# Patient Record
Sex: Female | Born: 1945 | Race: White | Hispanic: No | Smoking: Current every day smoker
Health system: Southern US, Community
[De-identification: ages and names within clinical notes are randomized; demographics above are authoritative.]

## PROBLEM LIST (undated history)

## (undated) DIAGNOSIS — I2510 Atherosclerotic heart disease of native coronary artery without angina pectoris: Secondary | ICD-10-CM

## (undated) DIAGNOSIS — J45909 Unspecified asthma, uncomplicated: Secondary | ICD-10-CM

## (undated) DIAGNOSIS — M4800 Spinal stenosis, site unspecified: Secondary | ICD-10-CM

## (undated) DIAGNOSIS — G8929 Other chronic pain: Secondary | ICD-10-CM

## (undated) DIAGNOSIS — R251 Tremor, unspecified: Secondary | ICD-10-CM

## (undated) DIAGNOSIS — M1990 Unspecified osteoarthritis, unspecified site: Secondary | ICD-10-CM

## (undated) DIAGNOSIS — I10 Essential (primary) hypertension: Secondary | ICD-10-CM

## (undated) DIAGNOSIS — J439 Emphysema, unspecified: Secondary | ICD-10-CM

## (undated) DIAGNOSIS — J449 Chronic obstructive pulmonary disease, unspecified: Secondary | ICD-10-CM

## (undated) DIAGNOSIS — M549 Dorsalgia, unspecified: Secondary | ICD-10-CM

## (undated) DIAGNOSIS — M4720 Other spondylosis with radiculopathy, site unspecified: Secondary | ICD-10-CM

## (undated) HISTORY — DX: Other spondylosis with radiculopathy, site unspecified: M47.20

## (undated) HISTORY — DX: Dorsalgia, unspecified: M54.9

## (undated) HISTORY — PX: CHOLECYSTECTOMY: SHX55

## (undated) HISTORY — DX: Tremor, unspecified: R25.1

## (undated) HISTORY — DX: Unspecified asthma, uncomplicated: J45.909

## (undated) HISTORY — DX: Other chronic pain: G89.29

## (undated) HISTORY — PX: JOINT REPLACEMENT: SHX530

## (undated) HISTORY — DX: Chronic obstructive pulmonary disease, unspecified: J44.9

---

## 2003-06-10 ENCOUNTER — Emergency Department (HOSPITAL_COMMUNITY): Admission: EM | Admit: 2003-06-10 | Discharge: 2003-06-11 | Payer: Self-pay | Admitting: Emergency Medicine

## 2003-07-12 ENCOUNTER — Emergency Department (HOSPITAL_COMMUNITY): Admission: EM | Admit: 2003-07-12 | Discharge: 2003-07-12 | Payer: Self-pay | Admitting: Emergency Medicine

## 2003-08-18 ENCOUNTER — Encounter: Admission: RE | Admit: 2003-08-18 | Discharge: 2003-09-26 | Payer: Self-pay | Admitting: Family Medicine

## 2005-03-21 ENCOUNTER — Emergency Department (HOSPITAL_COMMUNITY): Admission: EM | Admit: 2005-03-21 | Discharge: 2005-03-21 | Payer: Self-pay | Admitting: Emergency Medicine

## 2005-05-14 ENCOUNTER — Emergency Department (HOSPITAL_COMMUNITY): Admission: EM | Admit: 2005-05-14 | Discharge: 2005-05-15 | Payer: Self-pay | Admitting: Emergency Medicine

## 2005-09-29 ENCOUNTER — Emergency Department (HOSPITAL_COMMUNITY): Admission: EM | Admit: 2005-09-29 | Discharge: 2005-09-29 | Payer: Self-pay | Admitting: Emergency Medicine

## 2006-02-12 ENCOUNTER — Emergency Department (HOSPITAL_COMMUNITY): Admission: EM | Admit: 2006-02-12 | Discharge: 2006-02-12 | Payer: Self-pay | Admitting: Emergency Medicine

## 2006-02-25 ENCOUNTER — Emergency Department (HOSPITAL_COMMUNITY): Admission: EM | Admit: 2006-02-25 | Discharge: 2006-02-25 | Payer: Self-pay | Admitting: Family Medicine

## 2006-09-24 ENCOUNTER — Emergency Department (HOSPITAL_COMMUNITY): Admission: EM | Admit: 2006-09-24 | Discharge: 2006-09-24 | Payer: Self-pay | Admitting: Emergency Medicine

## 2007-08-10 ENCOUNTER — Encounter (INDEPENDENT_AMBULATORY_CARE_PROVIDER_SITE_OTHER): Payer: Self-pay | Admitting: Emergency Medicine

## 2007-08-10 ENCOUNTER — Ambulatory Visit: Payer: Self-pay | Admitting: *Deleted

## 2007-08-10 ENCOUNTER — Emergency Department (HOSPITAL_COMMUNITY): Admission: EM | Admit: 2007-08-10 | Discharge: 2007-08-10 | Payer: Self-pay | Admitting: Emergency Medicine

## 2007-12-18 ENCOUNTER — Emergency Department (HOSPITAL_COMMUNITY): Admission: EM | Admit: 2007-12-18 | Discharge: 2007-12-19 | Payer: Self-pay | Admitting: Physician Assistant

## 2008-04-02 ENCOUNTER — Inpatient Hospital Stay (HOSPITAL_COMMUNITY): Admission: EM | Admit: 2008-04-02 | Discharge: 2008-04-07 | Payer: Self-pay | Admitting: Emergency Medicine

## 2008-04-25 ENCOUNTER — Emergency Department (HOSPITAL_COMMUNITY): Admission: EM | Admit: 2008-04-25 | Discharge: 2008-04-25 | Payer: Self-pay | Admitting: Emergency Medicine

## 2009-02-22 ENCOUNTER — Emergency Department (HOSPITAL_COMMUNITY): Admission: EM | Admit: 2009-02-22 | Discharge: 2009-02-22 | Payer: Self-pay | Admitting: Family Medicine

## 2009-07-07 ENCOUNTER — Emergency Department (HOSPITAL_COMMUNITY): Admission: EM | Admit: 2009-07-07 | Discharge: 2009-07-07 | Payer: Self-pay | Admitting: Family Medicine

## 2009-08-26 ENCOUNTER — Emergency Department (HOSPITAL_COMMUNITY): Admission: EM | Admit: 2009-08-26 | Discharge: 2009-08-26 | Payer: Self-pay | Admitting: Family Medicine

## 2010-11-12 NOTE — Op Note (Signed)
Audrey [name], Audrey [name]         ACCOUNT NO.:  [id number]   MEDICAL RECORD NO.:  [id number]          PATIENT TYPE:  INP   LOCATION:  5511                         FACILITY:  [facility]   PHYSICIAN:  John C. Madilyn Fireman, M.D.    DATE OF BIRTH:  April 10, 1946   DATE OF PROCEDURE:  04/05/2008  DATE OF DISCHARGE:                               OPERATIVE REPORT   INDICATIONS FOR PROCEDURE:  Retained common bile duct stone.  ERCP 2  days ago, unsuccessful.   PROCEDURE:  The patient was placed in the prone position and placed on  pulse monitor with continuous low-flow oxygen delivered by nasal  cannula.  She was sedated with 150 mcg of IV fentanyl and 40 mg of IV  Versed, and also given 1 mg of IV glucagon.  The Olympus video side-  viewing endoscope was advanced blindly in the oropharynx, esophagus, and  stomach.  The pylorus was traversed and the papilla of Vater located on  the medial duodenal wall.  There was bile seen to drain from it.  Dr.  Evette Cristal and I both attempted cannulation, but I was only able to gain  shallow cannulation initially.  Dr. Evette Cristal then passed a wire up into a  duct which appeared to be the pancreatic duct which was confirmed by  injection.  With repositioning, he obtained selective cannulation of the  common bile duct.  Cholangiogram was obtained which showed a single  about 6 mm filling defects in the proximal or in the common hepatic  duct.  I then took over the procedure and performed a large  sphincterotomy using an adjustable 18-mm balloon catheter.  I was able  to deliver the stones and some crumbly fragments.  After 2 or 3 balloon  sweeps with no further debris or stones seen to come through and an  occlusion cholangiogram which showed no further defects, the procedure  was terminated.  The scope was withdrawn and the patient returned to the  recovery room in stable condition.  She tolerated the procedure well.  There were no immediate complications.   IMPRESSION:   Common bile duct stone status post removal with a balloon  after sphincterotomy.   PLAN:  Observe overnight and check liver function tests in the morning.           ______________________________  Everardo All. Madilyn Fireman, M.D.     JCH/MEDQ  D:  04/05/2008  T:  04/06/2008  Job:  [id number]

## 2010-11-12 NOTE — Discharge Summary (Signed)
NAMEMITSUE, PEERY NO.:  [id number]   MEDICAL RECORD NO.:  [id number]          PATIENT TYPE:  INP   LOCATION:  5511                         FACILITY:  [facility]   PHYSICIAN:  Shirley Friar, MDDATE OF BIRTH:  1946/04/19   DATE OF ADMISSION:  04/02/2008  DATE OF DISCHARGE:                               DISCHARGE SUMMARY   REASON FOR ADMISSION:  Abdominal pain, elevated liver function tests,  and common bile duct obstruction.   HISTORY OF PRESENT ILLNESS:  Ms. Counce is a 65 year old white female  who is unassigned, being seen at the request of Dr. Rosalia Hammers due to acute  onset of right upper quadrant pain and epigastric pain.  This pain  started Sunday morning at 2 a.m. and woke her up from sleep.  It  radiated into her back and lasted several hours and described as a sharp  and burning sensation.  It was associated with nausea and bilious  vomiting.  CT scan done in the emergency room shows common bile duct  dilation and distal common bile duct stones.  Complete radiology results  as noted in CT report.  Her T-bili was 1.8, ALP was 148, AST 611, and  ALT 293.  White blood count 14 and lipase 37.  She reports no appetite  for the past month and a 50-pound unintentional weight loss in the last  90 days.  She is status post open cholecystectomy in her early 80s.   PAST MEDICAL HISTORY:  1. COPD.  2. Coronary artery disease.  3. Hypertension.  4. Arthritis.  5. Spinal stenosis.  6. Tobacco abuse.   MEDICINES:  Aspirin, nitroglycerin as needed, and Duragesic patch.   ALLERGIES:  PENICILLIN.   FAMILY HISTORY:  Noncontributory.   SOCIAL HISTORY:  Tobacco abuse, denies alcohol or drugs.   REVIEW OF SYSTEMS:  Negative from GI standpoint, except as stated above.   PHYSICAL EXAMINATION:  VITAL SIGNS:  Temperature 97.5, pulse 77, blood  pressure 162/78, and O2 sats 98% on room air.  GENERAL:  Obese, no acute distress, and alert.  ABDOMEN:  Right upper quadrant  epigastric tenderness with guarding,  soft, nondistended, and positive bowel sounds.  CARDIOVASCULAR:  Regular rate and rhythm.  CHEST: Clear to auscultation anteriorly.   LABORATORY DATA:  White blood count 14.0, hemoglobin 15.1, and platelet  count 213.  T-bili 1.8, ALP 148, AST 611, ALT 293, and lipase 37.  All  other labs are placed in the hospital chart.   IMPRESSION:  A 65 year old white female presented with acute onset of  right upper quadrant epigastric pain with common bile duct dilation and  appears to have distal common bile duct stones.  Her elevated liver  function test in conjunction with the CT scan suggested extrahepatic  ductal dilation.  Differential diagnosis includes biliary stones versus  ampullary mass.  The pancreas appeared normal on CT scan per report.   We admitted the patient to the telemetry bed, placed on IV fluids with  bowel rest except for ice chips and sips of water.  Follow labs closely.  We will need to decide whether to do ERCP versus  MRCP pending on her  labs in the a.m.  The patient most likely will need ERCP due to concern  for common bile stones and acute onset of these symptoms.  I did discuss  the risk of ERCP with her and this will be readdressed again prior to  any procedures.      Shirley Friar, MD  Electronically Signed     VCS/MEDQ  D:  04/02/2008  T:  04/03/2008  Job:  [id number]   cc:   Everardo All. Madilyn Fireman, M.D.  Elvia Collum, MD

## 2010-11-12 NOTE — Discharge Summary (Signed)
[name], [name]         ACCOUNT NO.:  [id number]   MEDICAL RECORD NO.:  [id number]          PATIENT TYPE:  INP   LOCATION:  5511                         FACILITY:  [facility]   PHYSICIAN:  John C. Madilyn Fireman, M.D.    DATE OF BIRTH:  [birth year]   DATE OF ADMISSION:  04/02/2008  DATE OF DISCHARGE:  04/07/2008                               DISCHARGE SUMMARY   ADMITTING DIAGNOSIS:  Right upper quadrant pain and elevated liver  function tests.   DISCHARGE DIAGNOSIS:  Choledocholithiasis status post endoscopic  retrograde cholangiopancreatography x2.  The rest of her discharge  diagnoses are consistent with her past medical history including COPD,  coronary artery disease, hypertension, arthritis, spinal stenosis,  tobacco abuse, and status post cholecystectomy.   CONSULTATIONS:  None.   PROCEDURES:  The patient had 2 endoscopic retrograde  cholangiopancreatographies done during her hospital stay.  Her first one  done on April 03, 2008, was unsuccessful and that they were unable to  achieve decannulation into the common bile duct.  She did have a normal  pancreatogram.  The second endoscopic retrograde  cholangiopancreatography was done on April 05, 2008, by Dr. Dorena Cookey.  He successfully removed common bile duct stones and gravel with a  balloon after sphincterotomy.  He was assisted by Dr. Herbert Moors.   RADIOLOGICAL EXAMINATIONS:  She had a CT abdomen and pelvis done on  April 02, 2008.   IMPRESSION:  Common bile duct obstruction secondary to distal common  bile duct stones, measuring approximately 8 mm in diameter with no other  acute abnormalities.  She had fluoroscopy used during her endoscopic  retrograde cholangiopancreatographies.  Fluoroscopy done on April 05, 2008, demonstrated successful removal of common bile duct stone by Dr.  Madilyn Fireman.   HISTORY AND BRIEF HOSPITAL COURSE:  This patient is a 65 year old  Caucasian female who was seen at the [facility] at  the request of Dr. Rosalia Hammers due to acute onset of right upper quadrant and  epigastric pain.  The pain started on Sunday morning at 2:00 a.m. and  woke her from sleep.  Initial labs found her total bilirubin to be 1.8,  alk phos 148, AST 611, and ALT 293 with a white blood count of 14 and a  lipase of 37.  The patient reported that she had a 50 pounds  unintentional weight loss in the last 3 months, partially due to poor  finances and lack of food.  She had had an open cholecystectomy in her  early 63s.  She was admitted on April 02, 2008, placed on IV  hydration and pain medications, left n.p.o. for ERCP procedure the next  day.  ERCP was attempted but stones could not successfully be removed.  The patient was kept on empiric antibiotics and pain medications and  given a clear liquid diet.  The ERCP was reattempted on April 05, 2008,  and was successful.  Since that time, the patient has been closely  monitored.  Her diet has been advanced to a full liquid diet.  This  morning, she tells me that she is having very minimal pain.  She is  requesting discharge to home, says she feels good.   Labs this morning show an amylase of 83 and lipase 62.  LFTs were an AST  of 33, ALT 89, alk phos 154, total bilirubin 1.8.  Her BMETs within  normal limits other than a glucose of 130.  Her serum albumin 2.6,  hemoglobin 13.0, hematocrit 39.4, white count 6.6, and platelets were  148,000.   She is alert and oriented in no apparent distress.  Her heart has  regular rate and rhythm.  Lungs are clear.  Abdomen is soft,  nondistended, and tender to deep palpation in the epigastric.  She has  active bowel sounds.  She is being discharged to home in good condition.   Followup is scheduled on April 17, 2008, with Dr. Madilyn Fireman.   Discharge instructions include calling our office if she develops fever,  increased pain, or her loose bowel movement do not improve substantially  over the next few  days.   Her discharge medications include an 81 mg aspirin, nitroglycerin  sublingual p.r.n., and Darvocet-N 100 one pill q.4-6 h. p.r.n. pain.  She also was on a Duragesic patch prior to admission, and she has been  advised not to use that patch while she is on the Darvocet.      Audrey Police, PA    ______________________________  Everardo All Madilyn Fireman, M.D.    MLY/MEDQ  D:  04/07/2008  T:  04/07/2008  Job:  [id number]   cc:   Dr. Carroll Sage

## 2010-11-12 NOTE — Op Note (Signed)
NAMESHANEDRA, Audrey [name]         ACCOUNT NO.:  [id number]   MEDICAL RECORD NO.:  [id number]          PATIENT TYPE:  INP   LOCATION:  5511                         FACILITY:  [facility]   PHYSICIAN:  John C. Madilyn Fireman, M.D.    DATE OF BIRTH:  06/27/1946   DATE OF PROCEDURE:  04/03/2008  DATE OF DISCHARGE:                               OPERATIVE REPORT   OPERATION:  Endoscopic retrograde cholangiopancreatography.   INDICATIONS FOR PROCEDURE:  Abdominal pain in the patient with past  cholecystectomy with elevated liver function tests, common bile duct  stones seen on CT scan.   PROCEDURE:  She was placed in the supine position and placed on the  pulse monitor with continuous low-flow oxygen and delivered by nasal  cannula.  She was sedated with 100 mcg of IV fentanyl and 10 mg of IV  Versed.  The Olympus video side-viewing endoscope was advanced blindly  into the oropharynx, esophagus, and stomach.  The pylorus was traversed,  and the papilla of Vater located on the medial duodenal wall, had  somewhat smallish appearance, no bile was seen to exit from it.  Shallow  cannulation with a Wilson-Cook sphincterotome initially resulted in  opacification of the pancreatic duct, which appeared normal.  With  repositioning, a cholangiogram was obtained.  The films were somewhat  opaque and blurry possibly due to the patient's large habitus, but there  was filling of the common bile duct with the proximal duct seen best  with at least one large filling defect approximately 8 mm in the  proximal, common duct, or hepatic duct.  The distal duct was somewhat  difficult to follow, and we made multiple attempts to try to follow the  guidewire and sphincterotome cannula, but were unsuccessful in obtaining  deep cannulation despite switching to a tapered tip sphincterotome and a  smaller wire.  After multiple attempts were made to do this, with lack  of success, the procedure was terminated.  The scope was then  withdrawn,  and the patient returned to the recovery room in stable condition.  She  tolerated the procedure well, and there were no immediate complications.   IMPRESSION:  1. Dilated common bile duct with filling defects consistent with      stone.  2. Normal pancreatogram.  3. Unable to achieve deep cannulation to attempt endoscopic therapy      such as sphincterotomy, stone extraction, etc.   PLAN:  We will watch her overnight.  Continue antibiotics.  Check liver  function test in the morning.  Consider repeat attempt possibly with one  of my partners' assistant or consider a combined PTC, ERCP alternately.           ______________________________  Everardo All Madilyn Fireman, M.D.     JCH/MEDQ  D:  04/03/2008  T:  04/04/2008  Job:  [id number]

## 2011-03-21 LAB — DIFFERENTIAL
Basophils Absolute: 0.1
Basophils Relative: 1
Eosinophils Absolute: 0.2
Eosinophils Relative: 2
Lymphocytes Relative: 17
Lymphs Abs: 1.9
Monocytes Absolute: 0.6
Monocytes Relative: 6
Neutro Abs: 8.3 — ABNORMAL HIGH
Neutrophils Relative %: 75

## 2011-03-21 LAB — BASIC METABOLIC PANEL
BUN: 8
CO2: 29
Calcium: 9.2
Chloride: 105
Creatinine, Ser: 0.56
GFR calc Af Amer: >60
GFR calc non Af Amer: >60
Glucose, Bld: 100 — ABNORMAL HIGH
Potassium: 4.1
Sodium: 141

## 2011-03-21 LAB — URINALYSIS, ROUTINE W REFLEX MICROSCOPIC
Bilirubin Urine: NEGATIVE
Glucose, UA: NEGATIVE
Hgb urine dipstick: NEGATIVE
Ketones, ur: NEGATIVE
Nitrite: NEGATIVE
Protein, ur: NEGATIVE
Specific Gravity, Urine: 1.018
Urobilinogen, UA: 0.2
pH: 6

## 2011-03-21 LAB — POCT CARDIAC MARKERS
CKMB, poc: 4.4
Myoglobin, poc: 57.7
Operator id: 4295
Troponin i, poc: <0.05

## 2011-03-21 LAB — CBC
HCT: 43
Hemoglobin: 14.9
MCHC: 34.6
MCV: 92.9
Platelets: 236
RBC: 4.63
RDW: 13.5
WBC: 11 — ABNORMAL HIGH

## 2011-03-31 LAB — CBC
HCT: 38.1
HCT: 39.4
HCT: 45.5
HCT: 45.9
Hemoglobin: 13
Hemoglobin: 13.6
Hemoglobin: 14.8
Hemoglobin: 15.1 — ABNORMAL HIGH
Hemoglobin: 15.1 — ABNORMAL HIGH
MCHC: 33
MCHC: 33.1
MCHC: 33.1
MCHC: 33.1
MCHC: 33.5
MCHC: 33.7
MCV: 96.5
MCV: 96.8
MCV: 97
MCV: 97.3
Platelets: 136 — ABNORMAL LOW
Platelets: 146 — ABNORMAL LOW
Platelets: 213
RBC: 4.07
RBC: 4.6
RBC: 4.72
RBC: 4.73
RDW: 13.1
RDW: 13.4
RDW: 13.4
WBC: 14 — ABNORMAL HIGH
WBC: 15.9 — ABNORMAL HIGH
WBC: 6.8

## 2011-03-31 LAB — COMPREHENSIVE METABOLIC PANEL
ALT: 112 — ABNORMAL HIGH
ALT: 220 — ABNORMAL HIGH
ALT: 292 — ABNORMAL HIGH
ALT: 293 — ABNORMAL HIGH
AST: 160 — ABNORMAL HIGH
AST: 47 — ABNORMAL HIGH
AST: 611 — ABNORMAL HIGH
AST: 99 — ABNORMAL HIGH
Albumin: 2.5 — ABNORMAL LOW
Albumin: 2.5 — ABNORMAL LOW
Albumin: 3.6
Alkaline Phosphatase: 148 — ABNORMAL HIGH
Alkaline Phosphatase: 171 — ABNORMAL HIGH
Alkaline Phosphatase: 185 — ABNORMAL HIGH
BUN: 4 — ABNORMAL LOW
BUN: 5 — ABNORMAL LOW
BUN: 6
BUN: 6
CO2: 25
CO2: 26
CO2: 27
Calcium: 8.2 — ABNORMAL LOW
Calcium: 8.3 — ABNORMAL LOW
Calcium: 8.4
Calcium: 8.7
Calcium: 9.3
Chloride: 103
Chloride: 106
Chloride: 107
Creatinine, Ser: 0.59
Creatinine, Ser: 0.62
Creatinine, Ser: 0.65
GFR calc Af Amer: >60
GFR calc Af Amer: >60
GFR calc Af Amer: >60
GFR calc non Af Amer: >60
GFR calc non Af Amer: >60
GFR calc non Af Amer: >60
GFR calc non Af Amer: >60
Glucose, Bld: 112 — ABNORMAL HIGH
Glucose, Bld: 130 — ABNORMAL HIGH
Glucose, Bld: 150 — ABNORMAL HIGH
Glucose, Bld: 90
Glucose, Bld: 99
Potassium: 3.1 — ABNORMAL LOW
Potassium: 3.7
Potassium: 3.9
Sodium: 135
Sodium: 137
Sodium: 140
Sodium: 140
Total Bilirubin: 1.8 — ABNORMAL HIGH
Total Bilirubin: 3.6 — ABNORMAL HIGH
Total Bilirubin: 7.3 — ABNORMAL HIGH
Total Protein: 5.5 — ABNORMAL LOW
Total Protein: 5.8 — ABNORMAL LOW
Total Protein: 6.7

## 2011-03-31 LAB — DIFFERENTIAL
Basophils Absolute: 0
Basophils Relative: 0
Basophils Relative: 0
Eosinophils Absolute: 0
Eosinophils Absolute: 0
Eosinophils Relative: 0
Eosinophils Relative: 0
Lymphocytes Relative: 5 — ABNORMAL LOW
Lymphs Abs: 0.7
Monocytes Absolute: 0.3
Monocytes Absolute: 1
Monocytes Relative: 2 — ABNORMAL LOW
Monocytes Relative: 5
Neutro Abs: 13 — ABNORMAL HIGH
Neutrophils Relative %: 93 — ABNORMAL HIGH

## 2011-03-31 LAB — URINALYSIS, ROUTINE W REFLEX MICROSCOPIC
Glucose, UA: NEGATIVE
Hgb urine dipstick: NEGATIVE
Ketones, ur: NEGATIVE
Nitrite: NEGATIVE
Protein, ur: NEGATIVE
Specific Gravity, Urine: 1.022
Urobilinogen, UA: 1
pH: 8

## 2011-03-31 LAB — ABO/RH: ABO/RH(D): O POS

## 2011-03-31 LAB — HEPATIC FUNCTION PANEL
Alkaline Phosphatase: 212 — ABNORMAL HIGH
Bilirubin, Direct: 3.3 — ABNORMAL HIGH
Total Bilirubin: 5.1 — ABNORMAL HIGH

## 2011-03-31 LAB — PROTIME-INR
INR: 1.1
Prothrombin Time: 14.2

## 2011-03-31 LAB — LIPASE, BLOOD
Lipase: 25
Lipase: 37
Lipase: 62 — ABNORMAL HIGH

## 2011-03-31 LAB — AMYLASE: Amylase: 83

## 2013-05-18 ENCOUNTER — Encounter (HOSPITAL_BASED_OUTPATIENT_CLINIC_OR_DEPARTMENT_OTHER): Payer: Self-pay | Admitting: Emergency Medicine

## 2013-05-18 ENCOUNTER — Emergency Department (HOSPITAL_BASED_OUTPATIENT_CLINIC_OR_DEPARTMENT_OTHER)
Admission: EM | Admit: 2013-05-18 | Discharge: 2013-05-18 | Disposition: A | Discharge status: Home / Self Care | Payer: Medicare Other | Attending: Emergency Medicine | Admitting: Emergency Medicine

## 2013-05-18 DIAGNOSIS — M79609 Pain in unspecified limb: Secondary | ICD-10-CM | POA: Insufficient documentation

## 2013-05-18 DIAGNOSIS — J438 Other emphysema: Secondary | ICD-10-CM | POA: Insufficient documentation

## 2013-05-18 DIAGNOSIS — IMO0002 Reserved for concepts with insufficient information to code with codable children: Secondary | ICD-10-CM | POA: Insufficient documentation

## 2013-05-18 DIAGNOSIS — I10 Essential (primary) hypertension: Secondary | ICD-10-CM | POA: Insufficient documentation

## 2013-05-18 DIAGNOSIS — R209 Unspecified disturbances of skin sensation: Secondary | ICD-10-CM | POA: Insufficient documentation

## 2013-05-18 DIAGNOSIS — M545 Low back pain: Secondary | ICD-10-CM

## 2013-05-18 DIAGNOSIS — I2510 Atherosclerotic heart disease of native coronary artery without angina pectoris: Secondary | ICD-10-CM | POA: Insufficient documentation

## 2013-05-18 DIAGNOSIS — F17200 Nicotine dependence, unspecified, uncomplicated: Secondary | ICD-10-CM | POA: Insufficient documentation

## 2013-05-18 DIAGNOSIS — Z880 Allergy status to penicillin: Secondary | ICD-10-CM | POA: Insufficient documentation

## 2013-05-18 DIAGNOSIS — R350 Frequency of micturition: Secondary | ICD-10-CM | POA: Insufficient documentation

## 2013-05-18 DIAGNOSIS — G8929 Other chronic pain: Secondary | ICD-10-CM | POA: Insufficient documentation

## 2013-05-18 DIAGNOSIS — Z8739 Personal history of other diseases of the musculoskeletal system and connective tissue: Secondary | ICD-10-CM | POA: Insufficient documentation

## 2013-05-18 HISTORY — DX: Unspecified osteoarthritis, unspecified site: M19.90

## 2013-05-18 HISTORY — DX: Essential (primary) hypertension: I10

## 2013-05-18 HISTORY — DX: Spinal stenosis, site unspecified: M48.00

## 2013-05-18 HISTORY — DX: Atherosclerotic heart disease of native coronary artery without angina pectoris: I25.10

## 2013-05-18 HISTORY — DX: Emphysema, unspecified: J43.9

## 2013-05-18 MED ORDER — HYDROCODONE-ACETAMINOPHEN 5-325 MG PO TABS
1.0000 | ORAL_TABLET | ORAL | Status: DC | PRN
Start: 2013-05-18 — End: 2017-05-01

## 2013-05-18 MED ORDER — HYDROCODONE-ACETAMINOPHEN 5-325 MG PO TABS
1.0000 | ORAL_TABLET | Freq: Once | ORAL | Status: AC
  Administered 2013-05-18: 1 via ORAL
  Filled 2013-05-18: qty 1

## 2013-05-18 NOTE — ED Notes (Signed)
Pt c/o lower back pain which radiates down both legs to feet, hx of same

## 2013-05-18 NOTE — ED Provider Notes (Signed)
CSN: [id number]     Arrival date & time 05/18/13  1652 History   First MD Initiated Contact with Patient 05/18/13 1716     Chief Complaint  Patient presents with  . Back Pain   (Consider location/radiation/quality/duration/timing/severity/associated sxs/prior Treatment) HPI Comments: A 67 year old with hx of severe arthritis and spinal stenosis presents to ED complaining of bilateral leg pains. She states that pain is in her hips and radiates to her legs that started last night.  This is typical of her chronic pain but is now more intense in R leg. She also has numbness, tingling and coldness in her feet. She endorses urinary frequency and urgency but states this is baseline for her. No loss of BM control or numbness in pelvic area. She denies fever, chills, sob, cp or palpitation.  Patient is a 67 y.o. female presenting with back pain. The history is provided by the patient. No language interpreter was used.  Back Pain Associated symptoms: numbness   Associated symptoms: no chest pain, no dysuria, no fever and no headaches     Past Medical History  Diagnosis Date  . Emphysema   . Spinal stenosis   . Arthritis   . Coronary artery disease   . Hypertension    Past Surgical History  Procedure Laterality Date  . Cholecystectomy    . Joint replacement     History reviewed. No pertinent family history. History  Substance Use Topics  . Smoking status: Current Every Day Smoker -- 1.00 packs/day    Types: Cigarettes  . Smokeless tobacco: Not on file  . Alcohol Use: No   OB History   Grav Para Term Preterm Abortions TAB SAB Ect Mult Living                 Review of Systems  Constitutional: Negative for fever and chills.  Respiratory: Negative for shortness of breath.   Cardiovascular: Negative for chest pain, palpitations and leg swelling.  Gastrointestinal: Negative for nausea, vomiting and rectal pain.  Genitourinary: Positive for urgency and frequency. Negative for dysuria.   Musculoskeletal: Positive for back pain and myalgias. Negative for joint swelling.  Neurological: Positive for numbness. Negative for headaches.    Allergies  Codeine and Penicillins  Home Medications  No current outpatient prescriptions on file. BP 159/92  Pulse 92  Temp(Src) 97.3 F (36.3 C) (Oral)  Resp 18  SpO2 96% Physical Exam  Constitutional: She is oriented to person, place, and time. She appears well-developed and well-nourished. No distress.  Cardiovascular: Normal rate and intact distal pulses.   No murmur heard. Pulmonary/Chest: Effort normal. She has no wheezes. She has no rales.  Abdominal: Soft. There is no tenderness.  Musculoskeletal: Normal range of motion.  Painful full range of motion of right leg. Right lower extremity swollen, generally tender. Mild lumbar and paralumbar tenderness without swelling.  Neurological: She is alert and oriented to person, place, and time. She has normal reflexes. Coordination normal.  Skin: Skin is warm and dry.  Psychiatric: She has a normal mood and affect.    ED Course  Procedures (including critical care time) Labs Review Labs Reviewed - No data to display Imaging Review No results found.  EKG Interpretation   None       MDM  No diagnosis found. 1. Recurrent low back pain with radiculopathy  Patient seen with Dr. Anitra Lauth. Swelling of RLE felt to be chronic post-surgical changes. No acute edema. Symptoms c/w chronic back pain without neurologic deficits.  Arnoldo Hooker, PA-C 05/19/13 [phone]

## 2013-05-18 NOTE — ED Notes (Signed)
MD at bedside. 

## 2013-05-20 NOTE — ED Provider Notes (Signed)
Medical screening examination/treatment/procedure(s) were conducted as a shared visit with non-physician practitioner(s) and myself.  I personally evaluated the patient during the encounter.  EKG Interpretation   None       Pt here with exacerbation of chronic pain.  She denies any risk factors for DVT and despite some lower ext edema that is chronic due to multiple surgeries to the ankle no significant calf tenderness of swelling.  N/V intact and no signs of cellulitis.    Gwyneth Sprout, MD 05/20/13 1409

## 2017-02-04 ENCOUNTER — Institutional Professional Consult (permissible substitution): Payer: Medicare Other | Admitting: Internal Medicine

## 2017-04-16 ENCOUNTER — Institutional Professional Consult (permissible substitution): Payer: Medicare Other | Admitting: Internal Medicine

## 2017-05-01 ENCOUNTER — Encounter: Payer: Self-pay | Admitting: Internal Medicine

## 2017-05-01 ENCOUNTER — Ambulatory Visit (INDEPENDENT_AMBULATORY_CARE_PROVIDER_SITE_OTHER)
Admission: RE | Admit: 2017-05-01 | Discharge: 2017-05-01 | Disposition: A | Discharge status: Home / Self Care | Payer: Medicare Other | Source: Ambulatory Visit | Attending: Internal Medicine | Admitting: Internal Medicine

## 2017-05-01 ENCOUNTER — Ambulatory Visit (INDEPENDENT_AMBULATORY_CARE_PROVIDER_SITE_OTHER): Payer: Medicare Other | Admitting: Internal Medicine

## 2017-05-01 VITALS — BP 134/86 | HR 78 | Ht 66.0 in | Wt 251.0 lb

## 2017-05-01 DIAGNOSIS — Z23 Encounter for immunization: Secondary | ICD-10-CM

## 2017-05-01 DIAGNOSIS — J449 Chronic obstructive pulmonary disease, unspecified: Secondary | ICD-10-CM

## 2017-05-01 DIAGNOSIS — F17210 Nicotine dependence, cigarettes, uncomplicated: Secondary | ICD-10-CM | POA: Insufficient documentation

## 2017-05-01 MED ORDER — GLYCOPYRROLATE-FORMOTEROL 9-4.8 MCG/ACT IN AERO: 2.0000 | INHALATION_SPRAY | Freq: Two times a day (BID) | RESPIRATORY_TRACT | 0 refills | Status: DC

## 2017-05-01 MED ORDER — GLYCOPYRROLATE-FORMOTEROL 9-4.8 MCG/ACT IN AERO: 2.0000 | INHALATION_SPRAY | Freq: Two times a day (BID) | RESPIRATORY_TRACT | 11 refills | Status: AC

## 2017-05-01 NOTE — Patient Instructions (Signed)
Plan A = Automatic = Bevespi Take 2 puffs first thing in am and then another 2 puffs about 12 hours later.   Work on inhaler technique:  relax and gently blow all the way out then take a nice smooth deep breath back in, triggering the inhaler at same time you start breathing in.  Hold for up to 5 seconds if you can. Blow out thru nose. Rinse and gargle with water when done      Plan B = Backup Only use your albuterol as a rescue medication to be used if you can't catch your breath by resting or doing a relaxed purse lip breathing pattern.  - The less you use it, the better it will work when you need it. - Ok to use the inhaler up to 2 puffs  every 4 hours if you must but call for appointment if use goes up over your usual need - Don't leave home without it !!  (think of it like the spare tire for your car)    Please remember to go to the  x-ray department downstairs in the basement  for your tests - we will call you with the results when they are available.      Please schedule a follow up office visit in 4 weeks, sooner if needed with all active medications/ inhalers on hand

## 2017-05-01 NOTE — Assessment & Plan Note (Signed)
>   3 min discussion I reviewed the Fletcher curve with the patient that basically indicates  if you quit smoking when your best day FEV1 is still well preserved (as is clearly  the case here)  it is highly unlikely you will progress to severe disease and informed the patient there was  no medication on the market that has proven to alter the curve/ its downward trajectory  or the likelihood of progression of their disease(unlike other chronic medical conditions such as atheroclerosis where we do think we can change the natural hx with risk reducing meds)    Therefore stopping smoking and maintaining abstinence is the most important aspect of care, not choice of inhalers or for that matter, doctors.    Focus of rx is for now treating symptoms - informed pt she's the only one who can alter the natural hx of the dz but not willing yet at this point to commit to quit > Follow up per Primary Care planned

## 2017-05-01 NOTE — Assessment & Plan Note (Signed)
Body mass index is 40.51 kg/m.    No results found for: TSH   Contributing to gerd risk/ doe/reviewed the need and the process to achieve and maintain neg calorie balance > defer f/u primary care including intermittently monitoring thyroid status

## 2017-05-01 NOTE — Assessment & Plan Note (Addendum)
Spirometry 05/01/2017  FEV1 1.65 (66%)  Ratio 76 but  Texp only 3 secs and has curvature of copd s rx prior  - 05/01/2017  After extensive coaching HFA effectiveness =   75% from a baseline of 25% >> bevespi 2 bid     Pt is Group B in terms of symptom/risk and laba/lama therefore appropriate rx at this point.    Formulary restrictions will be an ongoing challenge for the forseable future and I would be happy to pick an alternative if the pt will first  provide me a list of them but pt  will need to return here for training for any new device that is required eg dpi vs hfa vs respimat.    In meantime we can always provide samples so the patient never runs out of any needed respiratory medications.  Given 4 weeks of samples of bevespi today with plan to return at 4 weeks to eval response to rx.    Total time devoted to counseling  > 50 % of initial 60 min office visit:  review case with pt/daughter Caroll discussion of options/alternatives/ personally creating written customized instructions  in presence of pt  then going over those specific  Instructions directly with the pt including how to use all of the meds but in particular covering each new medication in detail and the difference between the maintenance= "automatic" meds and the prns using an action plan format for the latter (If this problem/symptom => do that organization reading Left to right).  Please see AVS from this visit for a full list of these instructions which I personally wrote for this pt and  are unique to this visit.

## 2017-05-01 NOTE — Progress Notes (Signed)
Subjective:     Patient ID: Audrey [name], female   DOB: July 10, 1945,     MRN: [id number]  HPI    70 yowf active smoker dx as asthma by Dr Nira Retort age 41s and dx copd around 2008 on prn inhalers until 2017 and much worse since then referred to pulmonary clinic 05/01/2017 by Dr   Virl Son with GOLD II crtieria 05/01/2017     05/01/2017 1st  Pulmonary office visit/ Orville Mena   Chief Complaint  Patient presents with  . Pulmonary Consult    Referred by Dr. Virl Son.  Pt states she was dxed with COPD approx 10 yrs ago. She is having increased SOB for the past 3 months- gets SOB in the am when she first wakes up in the am, and with walking short distances such as from room to room at home.   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 No unusual exp hx or h/o childhood pna/ asthma or knowledge of premature birth.    Also denies any obvious fluctuation of symptoms with weather or environmental changes or other aggravating or alleviating factors except as outlined above   Current Allergies, Complete Past Medical History, Past Surgical History, Family History, and Social History were reviewed in [employer] record.  ROS  The following are not active complaints unless bolded Hoarseness, sore throat, dysphagia, dental problems, itching, sneezing,  nasal congestion or discharge of excess mucus or purulent secretions, ear ache,   fever, chills, sweats, unintended wt loss or wt gain,  classically pleuritic or exertional cp,  orthopnea pnd or leg swelling, presyncope, palpitations, abdominal pain, anorexia, nausea, vomiting, diarrhea  or change in bowel habits or change in bladder habits, change in stools or change in urine, dysuria, hematuria,  rash, arthralgias, visual complaints, headache, numbness, weakness or ataxia or problems with walking or coordination,  change in mood/affect or memory.        Current Meds  Medication Sig  . albuterol (VENTOLIN HFA) 108 (90 Base) MCG/ACT inhaler Inhale 2 puffs into the lungs every 6 (six) hours as needed for wheezing or shortness of breath.  . gabapentin (NEURONTIN) 300 MG capsule Take 300 mg by mouth 3 (three) times daily.  . traMADol-acetaminophen (ULTRACET) 37.5-325 MG tablet Take 1 tablet by mouth every 6 (six) hours as needed.  . [  budesonide-formoterol (SYMBICORT) 160-4.5 MCG/ACT inhaler Inhale 2 puffs into the lungs 2 (two) times daily.  Marland Kitchen   ipratropium (ATROVENT HFA) 17 MCG/ACT inhaler Inhale 1 puff into the lungs 2 (two) times daily.           Review of Systems     Objective:   Physical Exam    amb obese  wf nad  - struggles to get on exam table s assistance not related to sob but to obesity/ "bad knees "   Wt Readings from Last 3 Encounters:  05/01/17 251 lb (113.9  kg)    Vital signs reviewed   - Note on arrival 02 sats  95% on RA     HEENT: nl dentition, turbinates bilaterally, and oropharynx. Nl external ear canals without cough reflex   NECK :  without JVD/Nodes/TM/ nl carotid upstrokes bilaterally   LUNGS: no acc muscle use,  Nl contour chest which is clear to A and P though bs are distant    CV:  RRR  no s3 or murmur or increase in P2, and  asym pitting edema R > L with assoc chronic venous stasis changes   ABD:  soft and nontender with nl inspiratory excursion in the supine position. No bruits or organomegaly appreciated, bowel sounds nl  MS:  Nl gait/ ext warm without deformities, calf  tenderness, cyanosis or clubbing No obvious joint restrictions   SKIN: warm and dry without lesions    NEURO:  alert, approp, nl sensorium with  no motor or cerebellar deficits apparent.     CXR PA and Lateral:   05/01/2017 :    I personally reviewed images and agree with radiology impression as follows:   1. Mild pulmonary hyperinflation without active pulmonary disease. 2. Aortic atherosclerosis at the arch.     Assessment:

## 2017-05-04 NOTE — Progress Notes (Signed)
Spoke with pt and notified of results per Dr. Wert. Pt verbalized understanding and denied any questions. 

## 2017-05-29 ENCOUNTER — Ambulatory Visit: Payer: Medicare Other | Admitting: Internal Medicine

## 2017-06-02 ENCOUNTER — Ambulatory Visit: Payer: Medicare Other | Admitting: Internal Medicine

## 2017-09-14 ENCOUNTER — Other Ambulatory Visit: Payer: Self-pay | Admitting: Family Medicine

## 2017-09-14 DIAGNOSIS — R600 Localized edema: Secondary | ICD-10-CM

## 2017-09-18 ENCOUNTER — Other Ambulatory Visit: Payer: Medicare Other

## 2017-09-24 ENCOUNTER — Ambulatory Visit
Admission: RE | Admit: 2017-09-24 | Discharge: 2017-09-24 | Disposition: A | Discharge status: Home / Self Care | Payer: Medicare Other | Source: Ambulatory Visit | Attending: Family Medicine | Admitting: Family Medicine

## 2017-09-24 DIAGNOSIS — R600 Localized edema: Secondary | ICD-10-CM

## 2017-12-22 ENCOUNTER — Ambulatory Visit: Payer: Medicare Other | Admitting: Neurology

## 2018-01-08 ENCOUNTER — Encounter: Payer: Self-pay | Admitting: *Deleted

## 2018-01-11 ENCOUNTER — Ambulatory Visit (INDEPENDENT_AMBULATORY_CARE_PROVIDER_SITE_OTHER): Payer: Medicare Other | Admitting: Neurology

## 2018-01-11 ENCOUNTER — Encounter: Payer: Self-pay | Admitting: Neurology

## 2018-01-11 ENCOUNTER — Telehealth: Payer: Self-pay | Admitting: Neurology

## 2018-01-11 VITALS — BP 142/76 | HR 60 | Ht 65.0 in | Wt 267.0 lb

## 2018-01-11 DIAGNOSIS — F40240 Claustrophobia: Secondary | ICD-10-CM

## 2018-01-11 DIAGNOSIS — R251 Tremor, unspecified: Secondary | ICD-10-CM

## 2018-01-11 DIAGNOSIS — F419 Anxiety disorder, unspecified: Secondary | ICD-10-CM

## 2018-01-11 MED ORDER — ALPRAZOLAM 0.5 MG PO TABS: ORAL_TABLET | ORAL | 0 refills | Status: AC

## 2018-01-11 NOTE — Patient Instructions (Addendum)
You have a tremor of both hands. I do not see any signs or symptoms of parkinson's like disease or what we call parkinsonism.   For your tremor, I would not recommend any new medication for fear of side effects (especially sleepiness) or medication interactions, especially in light of balance problems and exacerbating your lung disease. We do not have to make a follow up appointment.   Please remember, that any kind of tremor may be exacerbated by anxiety, anger, nervousness, excitement, dehydration, sleep deprivation, by caffeine, and low blood sugar values or blood osugar fluctuations. Please discuss with your primary care the possibility to address your depression and anxiety. Please reduce your caffeine intake gradually.  So long as your brain MRI is age appropriate, we will call you and I will see you as needed.

## 2018-01-11 NOTE — Telephone Encounter (Signed)
Medicare/medicaid order sent to GI. They will reach out to the pt to schedule.  °

## 2018-01-11 NOTE — Progress Notes (Signed)
Subjective:    Patient ID: Audrey [name] is a 72 y.o. female.  HPI     Huston Foley, MD, PhD [facility] [address], [address] [address] [place], [place] [address]  Dear Dr. Rene Kocher,   I saw your patient, Audrey [name], upon your kind request in my neurologic clinic today for initial consultation of her tremors. The patient is accompanied by her daughter today. As you know, [name] is a 72 year old right-handed woman with an underlying medical history of lower extremity edema, COPD, smoking, lumbar spinal stenosis and morbid obesity, who reports a bilateral hand tremor for the past year, more noticeable in the past 3 months. I reviewed your office note from 09/14/2017, which you kindly included. She had recent blood work through your office which I reviewed: Hemoglobin A1c was 5.2 on 09/14/2017, CMP unremarkable, CBC with differential unremarkable, with the exception of MCH of 32.7 which is mildly elevated. She had a TSH checked. She admits to being depressed and anxious at times. She has lack of motivation and stays in her room a lot she admits. She lives with her daughter. She reports a family history of tremor in her mother and Parkinson's disease in a maternal uncle. She smokes one pack per day and is currently not working on smoking cessation. She has noticed tingling and burning sensation at times in her arms, radiating from the right shoulder and also in her feet. She had a venous Doppler ultrasound of both lower extremities for swelling in March 2019 which was negative for DVT. She drinks quite a bit of caffeine in the form of coffee which is 1 cup per day on average and 3-4 bottles of soda per day on average. She used to work at [employer] as a Production assistant, radio and also as a Lawyer. She does not drink alcohol. She had a brain MRI with and without contrast in January 2004 which was reported as unremarkable and she had an MRI brain with and without contrast in  July 2004, which was reported as normal.  Her Past Medical History Is Significant For: Past Medical History:  Diagnosis Date  . Arthritis   . Asthma   . Chronic back pain   . COPD (chronic obstructive pulmonary disease) (HCC)   . Coronary artery disease   . Emphysema   . Hypertension   . Osteoarthritis of spine with radiculopathy   . Spinal stenosis   . Tremor     Her Past Surgical History Is Significant For: Past Surgical History:  Procedure Laterality Date  . CHOLECYSTECTOMY    . JOINT REPLACEMENT      Her Family History Is Significant For: No family history on file.  Her Social History Is Significant For: Social History   Socioeconomic History  . Marital status: Widowed    Spouse name: Not on file  . Number of children: Not on file  . Years of education: Not on file  . Highest education level: Not on file  Occupational History  . Not on file  Social Needs  . Financial resource strain: Not on file  . Food insecurity:    Worry: Not on file    Inability: Not on file  . Transportation needs:    Medical: Not on file    Non-medical: Not on file  Tobacco Use  . Smoking status: Current Every Day Smoker    Packs/day: 2.00    Years: 58.00    Pack years: 116.00    Types: Cigarettes  .  Smokeless tobacco: Never Used  Substance and Sexual Activity  . Alcohol use: No  . Drug use: No  . Sexual activity: Never    Birth control/protection: None  Lifestyle  . Physical activity:    Days per week: Not on file    Minutes per session: Not on file  . Stress: Not on file  Relationships  . Social connections:    Talks on phone: Not on file    Gets together: Not on file    Attends religious service: Not on file    Active member of club or organization: Not on file    Attends meetings of clubs or organizations: Not on file    Relationship status: Not on file  Other Topics Concern  . Not on file  Social History Narrative   Lives   Caffeine use:     Her Allergies  Are:  Allergies  Allergen Reactions  . Codeine   . Penicillins   . Lamotrigine Rash  :   Her Current Medications Are:  Outpatient Encounter Medications as of 01/11/2018  Medication Sig  . albuterol (VENTOLIN HFA) 108 (90 Base) MCG/ACT inhaler Inhale 2 puffs into the lungs every 6 (six) hours as needed for wheezing or shortness of breath.  . furosemide (LASIX) 20 MG tablet Take 20 mg by mouth daily as needed.   . gabapentin (NEURONTIN) 300 MG capsule Take 300 mg by mouth 3 (three) times daily.  . Glycopyrrolate-Formoterol (BEVESPI AEROSPHERE) 9-4.8 MCG/ACT AERO Inhale 2 puffs into the lungs 2 (two) times daily.  . potassium chloride (KLOR-CON) 20 MEQ packet Take 20 mEq by mouth 2 (two) times daily.  . [DISCONTINUED] meloxicam (MOBIC) 15 MG tablet Take 15 mg by mouth daily.  . [DISCONTINUED] traMADol-acetaminophen (ULTRACET) 37.5-325 MG tablet Take 1 tablet by mouth every 6 (six) hours as needed.   No facility-administered encounter medications on file as of 01/11/2018.   :   Review of Systems:  Out of a complete 14 point review of systems, all are reviewed and negative with the exception of these symptoms as listed below:  Review of Systems  Neurological:       Pt presents today to discuss her tremors. Pt has noticed the tremors in her hands and legs for about a year but they have worsened over the past 3 months. Pt is right handed. Pt is also complaining her nerves "burning."    Objective:  Neurological Exam  Physical Exam Physical Examination:   Vitals:   01/11/18 1440  BP: (!) 142/76  Pulse: 60    General Examination: The patient is a very pleasant 72 y.o. female in no acute distress. She appears well-developed and well-nourished and adequately groomed. Quite anxious at times, briefly tearful.  HEENT: Normocephalic, atraumatic, pupils are equal, round and reactive to light and accommodation. Extraocular tracking is good without limitation to gaze excursion or nystagmus  noted. Normal smooth pursuit is noted. Hearing is grossly intact. Face is symmetric with normal facial animation and normal facial sensation. Speech is clear with no dysarthria noted. There is no hypophonia. There is no lip, neck/head, jaw or voice tremor. Neck is supple with full range of passive and active motion. Oropharynx exam reveals: Tongue protrudes centrally and palate elevates symmetrically, no oral facial dyskinesias.  Chest: Clear to auscultation without wheezing, rhonchi or crackles noted, but distant breath sounds noted.Intermittent coughing spells.  Heart: S1+S2+0, regular and normal without murmurs, rubs or gallops noted.   Abdomen: Soft, non-tender and non-distended with normal  bowel sounds appreciated on auscultation.  Extremities: There is 2+ pitting edema in the distal lower extremities bilaterally. Discoloration noticed and right more than left feet and particularly toes. Toes and forefoot are cooler to touch than hands. Pedal pulses difficult to tell R foot.   Skin: Warm and dry without trophic changes noted.  Musculoskeletal: exam reveals no obvious joint deformities, tenderness or joint swelling or erythema.   Neurologically:  Mental status: The patient is awake, alert and oriented in all 4 spheres. Her immediate and remote memory, attention, language skills and fund of knowledge are appropriate. There is no evidence of aphasia, agnosia, apraxia or anomia. Speech is clear with normal prosody and enunciation. Thought process is linear. Mood is normal and affect is normal.  Cranial nerves II - XII are as described above under HEENT exam.  Motor exam: Normal bulk, strength and tone is noted. There is no drift, Romberg is not tested due to safety concerns. Fine motor skills are globally impaired, difficult with right foot as she has a  Right ankle fusion.  On 01/11/2018:Archimedes spiral drawing she has coarse difficulty with the right hand and also difficulty with the left hand  but gives up with the left hand. Handwriting is illegible, large and tremulous. She has a intermittent bilateral resting tremor, bilateral postural tremor which is moderate in degree in mild action tremor bilaterally, she has no lower extremity tremor.  Cerebellar testing: No dysmetria or intention tremor on finger to nose testing. Heel to shin is unremarkable bilaterally. There is no truncal or gait ataxia.   Sensory exam: intact to light touch in the upper and lower extremities.  Gait, station and balance: She stands with difficulty. And reports back pain. She has increase in lumbar kyphosis. She did not bring a cane or walker. She walks slowly and cautiously.   Assessment and Plan:    In summary, WILLIE LOY is a very pleasant 72 y.o.-year old female with an underlying medical history of lower extremity edema, COPD, smoking, lumbar spinal stenosis and morbid obesity, who Presents for tremor evaluation. On examination she does have a bilateral upper extremity tremor. She does not have any telltale signs of parkinsonism and is reassured in that regard. I talked to patient and her daughter at length today. She may have essential tremor. Tremor may be exacerbated in part by using inhalers for her COPD. Suboptimally treated anxiety likely also exacerbates her tremor. She is advised that certain antidepressant medications can also exacerbate tremors unfortunately. She may benefit from addressing her anxiety and depression and is encouraged to make an appointment with you. She had Recent blood work which I reviewed. Caffeine can also be a trigger and she strongly advised to quit smoking but also to gradually reduce her soda/caffeine intake. She has notable discoloration in her feet which is worse on the right. She is encouraged to talk to you about potentially seeing a vascular specialist. She may have peripheral vascular disease which can be caused by chronic smoking. Unfortunately, from the tremor  treatment standpoint I would not feel comfortable suggesting Mysoline because her balance is impaired. I would not recommend a beta blocker because of her COPD diagnosis.on the diagnostic standpoint I would like to proceed with a brain MRI for comparison with her scans from 2004. I did not see any other focal neurological signs. So long as her brain MRI is age-appropriate I will see her back on an as-needed basis. I answered all their questions today and the patient  and her daughter  were in agreement.  Thank you very much for allowing me to participate in the care of this nice patient. If I can be of any further assistance to you please do not hesitate to call me at [phone].  Sincerely,   Huston Foley, MD, PhD

## 2018-01-22 ENCOUNTER — Other Ambulatory Visit: Payer: Self-pay

## 2018-01-22 DIAGNOSIS — I872 Venous insufficiency (chronic) (peripheral): Secondary | ICD-10-CM

## 2018-02-06 ENCOUNTER — Inpatient Hospital Stay: Admission: RE | Admit: 2018-02-06 | Payer: Medicare Other | Source: Ambulatory Visit

## 2018-03-19 ENCOUNTER — Encounter: Payer: Self-pay | Admitting: Vascular Surgery

## 2018-03-19 ENCOUNTER — Ambulatory Visit (HOSPITAL_COMMUNITY): Payer: Medicare Other | Attending: Vascular Surgery

## 2018-03-19 ENCOUNTER — Encounter: Payer: Medicare Other | Admitting: Vascular Surgery

## 2018-04-14 ENCOUNTER — Other Ambulatory Visit: Payer: Self-pay | Admitting: Family Medicine

## 2018-04-14 DIAGNOSIS — F17200 Nicotine dependence, unspecified, uncomplicated: Secondary | ICD-10-CM

## 2018-04-14 DIAGNOSIS — Z1231 Encounter for screening mammogram for malignant neoplasm of breast: Secondary | ICD-10-CM

## 2018-04-22 ENCOUNTER — Ambulatory Visit: Payer: Medicare Other

## 2018-05-19 ENCOUNTER — Inpatient Hospital Stay: Admission: RE | Admit: 2018-05-19 | Payer: Medicare Other | Source: Ambulatory Visit

## 2018-12-11 IMAGING — US US EXTREM LOW VENOUS BILAT
1 series · 13 of 24 positions shown · non-contrast
Comparison: None.

CLINICAL DATA: Bilateral leg swelling



[Series 1: us extrem low venous bilat · 0.11mm/px · 13 of 51 slices shown]
[im 1/51]
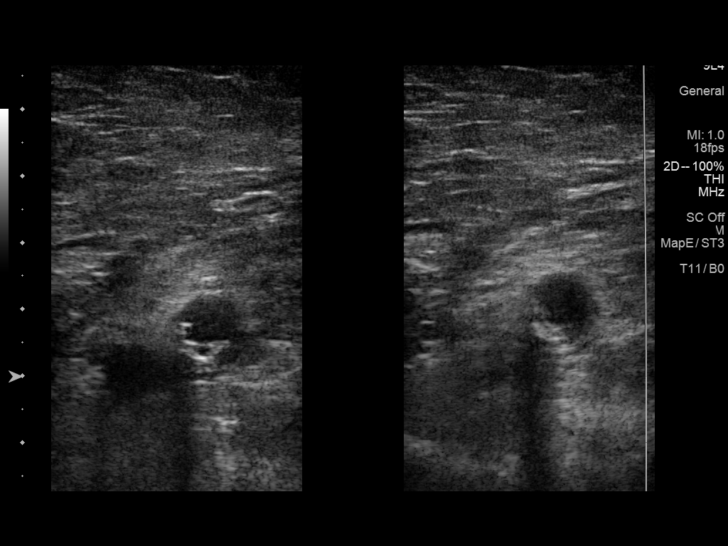
[im 5/51]
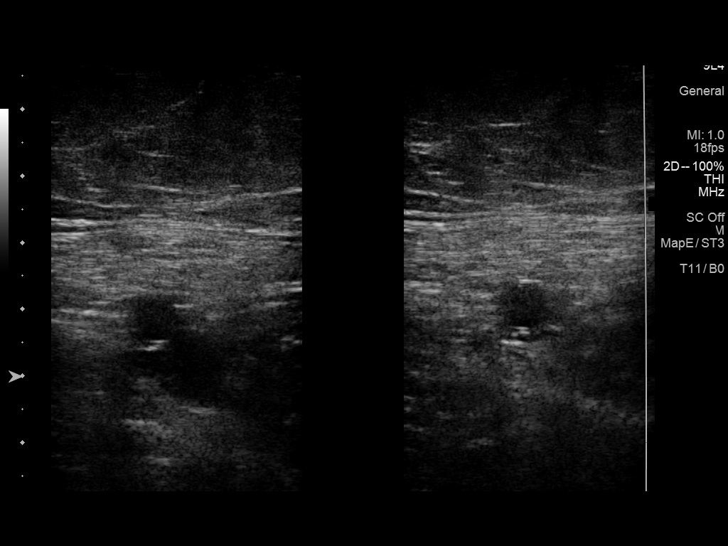
[im 9/51]
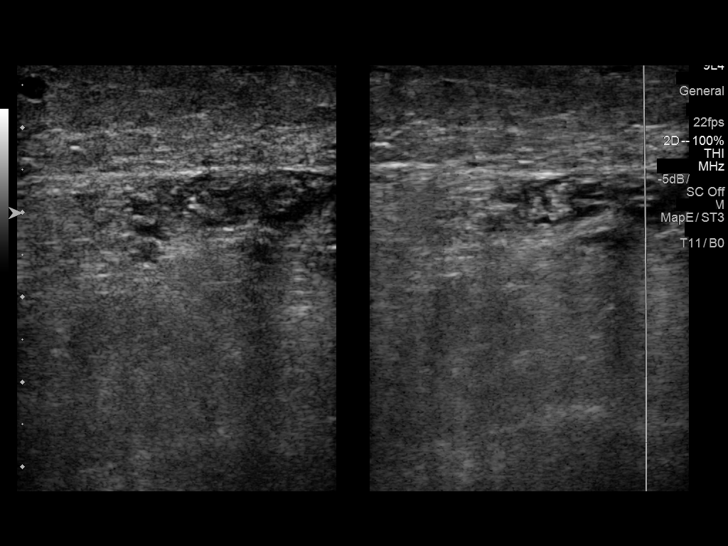
[im 14/51]
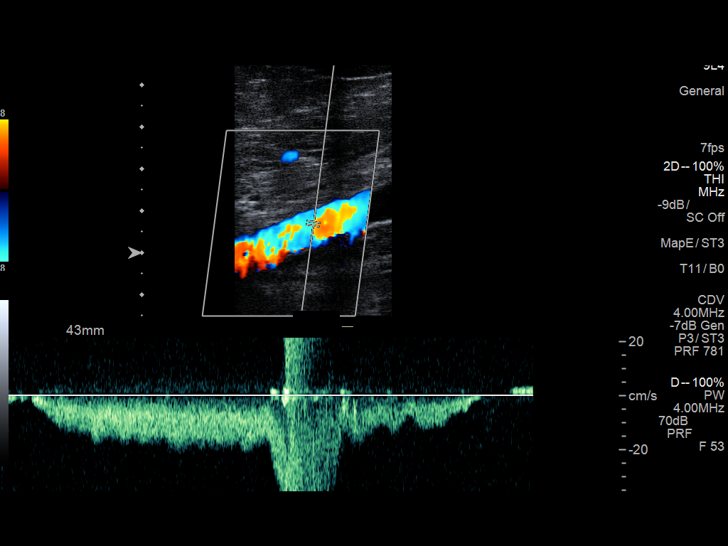
[im 18/51]
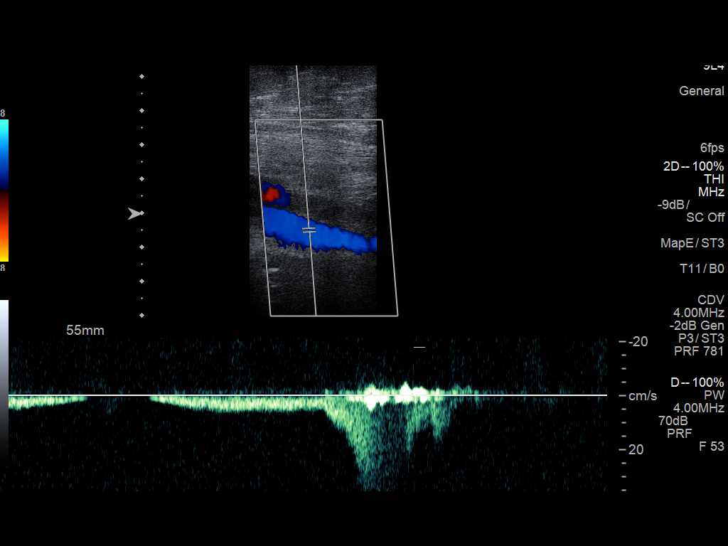
[im 22/51]
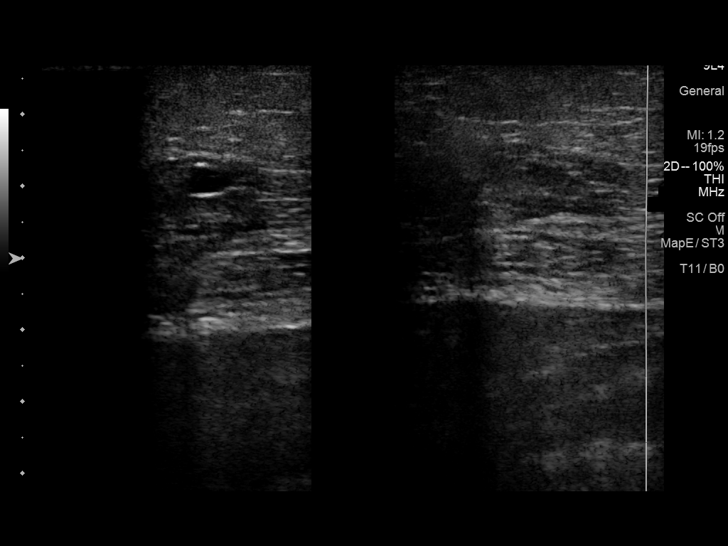
[im 27/51]
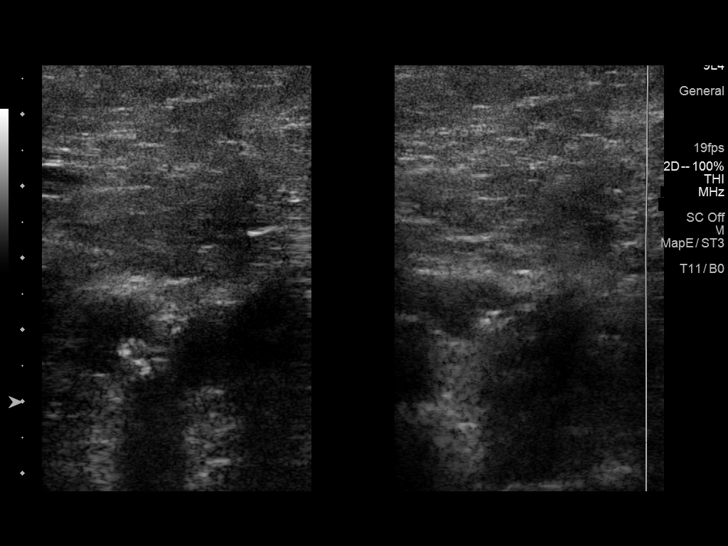
[im 29/51]
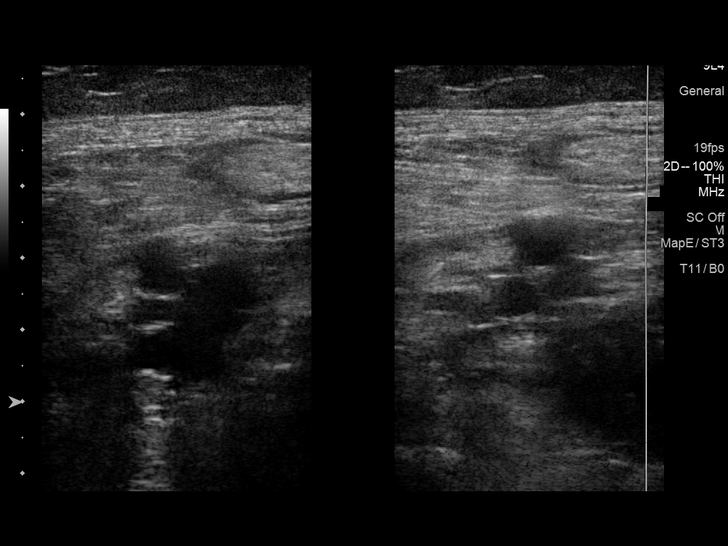
[im 33/51]
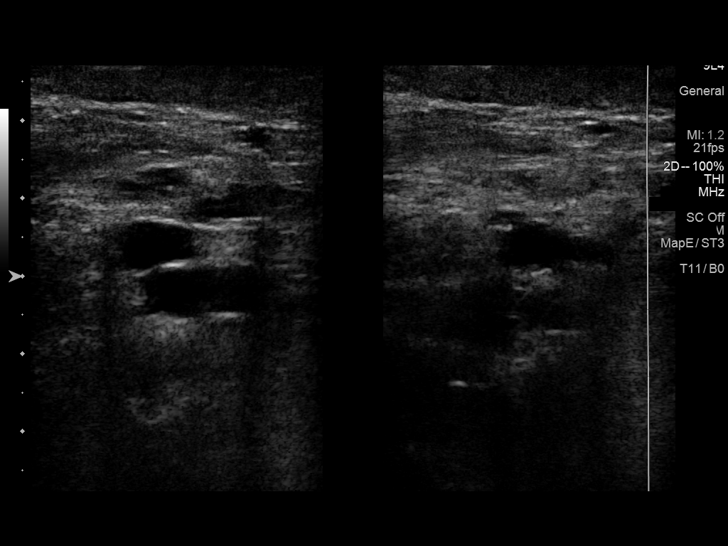
[im 37/51]
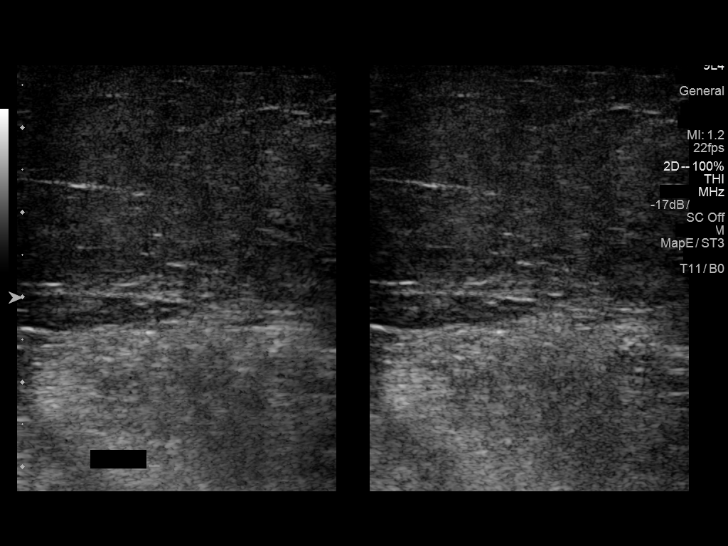
[im 42/51]
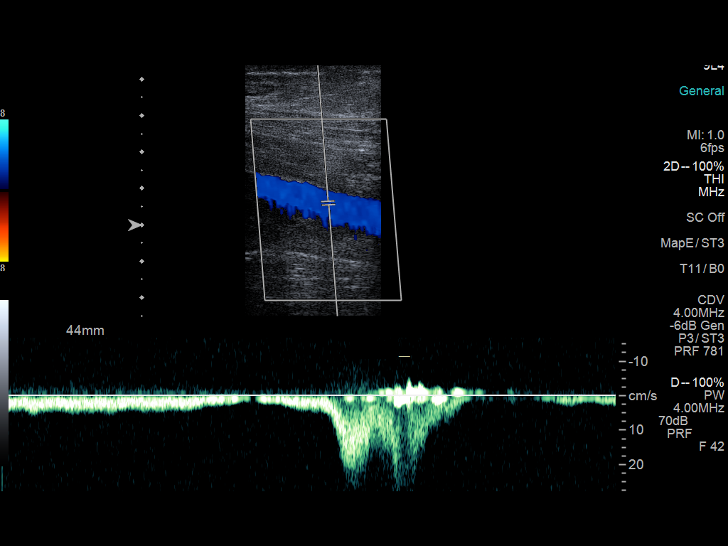
[im 46/51]
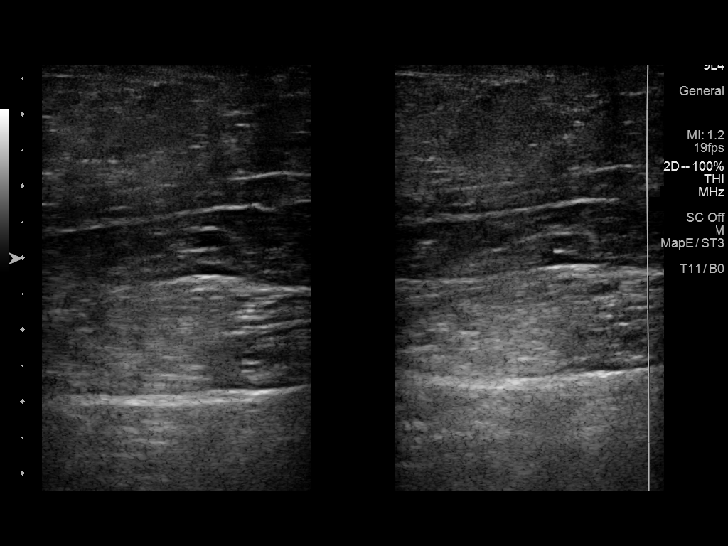
[im 51/51]
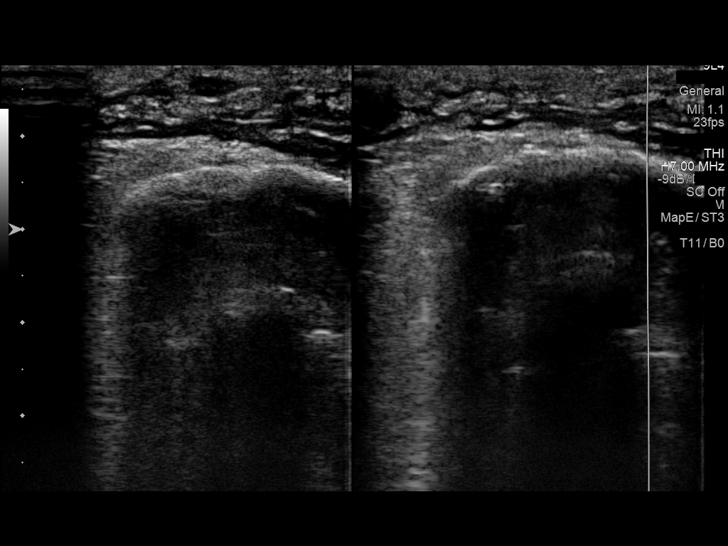

[13 of 24 positions shown; findings below may reference images not displayed]

FINDINGS: RIGHT LOWER EXTREMITY

Common Femoral Vein: No evidence of thrombus. Normal
compressibility, respiratory phasicity and response to augmentation.

Saphenofemoral Junction: No evidence of thrombus. Normal
compressibility and flow on color Doppler imaging.

Profunda Femoral Vein: No evidence of thrombus. Normal
compressibility and flow on color Doppler imaging.

Femoral Vein: No evidence of thrombus. Normal compressibility,
respiratory phasicity and response to augmentation.

Popliteal Vein: No evidence of thrombus. Normal compressibility,
respiratory phasicity and response to augmentation.

Calf Veins: No evidence of thrombus. Normal compressibility and flow
on color Doppler imaging.

Superficial Great Saphenous Vein: No evidence of thrombus. Normal
compressibility.

Venous Reflux:  None.

Other Findings:  None.

LEFT LOWER EXTREMITY

Common Femoral Vein: No evidence of thrombus. Normal
compressibility, respiratory phasicity and response to augmentation.

Saphenofemoral Junction: No evidence of thrombus. Normal
compressibility and flow on color Doppler imaging.

Profunda Femoral Vein: No evidence of thrombus. Normal
compressibility and flow on color Doppler imaging.

Femoral Vein: No evidence of thrombus. Normal compressibility,
respiratory phasicity and response to augmentation.

Popliteal Vein: No evidence of thrombus. Normal compressibility,
respiratory phasicity and response to augmentation.

Calf Veins: No evidence of thrombus. Normal compressibility and flow
on color Doppler imaging.

Superficial Great Saphenous Vein: No evidence of thrombus. Normal
compressibility.

Venous Reflux:  None.

Other Findings:  None.
IMPRESSION: No evidence of deep venous thrombosis.

## 2021-04-18 ENCOUNTER — Other Ambulatory Visit: Payer: Self-pay | Admitting: Family Medicine

## 2021-04-18 DIAGNOSIS — Z1231 Encounter for screening mammogram for malignant neoplasm of breast: Secondary | ICD-10-CM

## 2022-04-18 ENCOUNTER — Ambulatory Visit: Payer: Medicare HMO | Admitting: Podiatry

## 2022-04-25 ENCOUNTER — Ambulatory Visit: Payer: Medicare HMO | Admitting: Podiatry

## 2023-04-27 ENCOUNTER — Ambulatory Visit: Payer: Medicare HMO | Admitting: Podiatry

## 2023-05-05 ENCOUNTER — Ambulatory Visit: Payer: Medicare HMO | Admitting: Podiatry

## 2023-05-11 ENCOUNTER — Encounter: Payer: Medicare HMO | Admitting: Podiatry

## 2023-05-11 NOTE — Progress Notes (Signed)
Patient was a no-show for scheduled appointment today.
